# Patient Record
Sex: Male | Born: 1950 | Race: Asian | Marital: Married | Smoking: Former smoker
Health system: Southern US, Community
[De-identification: ages and names within clinical notes are randomized; demographics above are authoritative.]

## PROBLEM LIST (undated history)

## (undated) DIAGNOSIS — F32A Depression, unspecified: Secondary | ICD-10-CM

## (undated) DIAGNOSIS — F329 Major depressive disorder, single episode, unspecified: Secondary | ICD-10-CM

## (undated) DIAGNOSIS — G894 Chronic pain syndrome: Secondary | ICD-10-CM

## (undated) DIAGNOSIS — F4310 Post-traumatic stress disorder, unspecified: Secondary | ICD-10-CM

## (undated) DIAGNOSIS — N400 Enlarged prostate without lower urinary tract symptoms: Secondary | ICD-10-CM

## (undated) DIAGNOSIS — N200 Calculus of kidney: Secondary | ICD-10-CM

## (undated) HISTORY — DX: Calculus of kidney: N20.0

## (undated) HISTORY — DX: Major depressive disorder, single episode, unspecified: F32.9

## (undated) HISTORY — PX: CATARACT EXTRACTION, BILATERAL: SHX1313

## (undated) HISTORY — DX: Depression, unspecified: F32.A

## (undated) HISTORY — DX: Benign prostatic hyperplasia without lower urinary tract symptoms: N40.0

## (undated) HISTORY — DX: Post-traumatic stress disorder, unspecified: F43.10

## (undated) HISTORY — DX: Chronic pain syndrome: G89.4

---

## 1987-09-30 DIAGNOSIS — G894 Chronic pain syndrome: Secondary | ICD-10-CM

## 1987-09-30 HISTORY — DX: Chronic pain syndrome: G89.4

## 2013-09-29 DIAGNOSIS — N200 Calculus of kidney: Secondary | ICD-10-CM

## 2013-09-29 DIAGNOSIS — N400 Enlarged prostate without lower urinary tract symptoms: Secondary | ICD-10-CM

## 2013-09-29 HISTORY — DX: Benign prostatic hyperplasia without lower urinary tract symptoms: N40.0

## 2013-09-29 HISTORY — DX: Calculus of kidney: N20.0

## 2015-07-10 ENCOUNTER — Ambulatory Visit: Payer: Self-pay | Admitting: Internal Medicine

## 2015-07-20 ENCOUNTER — Ambulatory Visit (INDEPENDENT_AMBULATORY_CARE_PROVIDER_SITE_OTHER): Payer: Medicaid Other | Admitting: Internal Medicine

## 2015-07-20 ENCOUNTER — Encounter: Payer: Self-pay | Admitting: Internal Medicine

## 2015-07-20 VITALS — BP 114/72 | HR 68 | Ht 63.0 in | Wt 146.0 lb

## 2015-07-20 DIAGNOSIS — M79672 Pain in left foot: Secondary | ICD-10-CM

## 2015-07-20 DIAGNOSIS — F329 Major depressive disorder, single episode, unspecified: Secondary | ICD-10-CM

## 2015-07-20 DIAGNOSIS — M25572 Pain in left ankle and joints of left foot: Secondary | ICD-10-CM

## 2015-07-20 DIAGNOSIS — M545 Low back pain: Secondary | ICD-10-CM | POA: Diagnosis not present

## 2015-07-20 DIAGNOSIS — F32A Depression, unspecified: Secondary | ICD-10-CM

## 2015-07-20 DIAGNOSIS — F4310 Post-traumatic stress disorder, unspecified: Secondary | ICD-10-CM

## 2015-07-20 MED ORDER — SERTRALINE HCL 50 MG PO TABS: ORAL_TABLET | ORAL | Status: DC
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
## 2015-07-26 ENCOUNTER — Ambulatory Visit
Admission: RE | Admit: 2015-07-26 | Discharge: 2015-07-26 | Disposition: A | Discharge status: Home / Self Care | Payer: Medicaid Other | Source: Ambulatory Visit | Attending: Internal Medicine | Admitting: Internal Medicine

## 2015-07-26 DIAGNOSIS — M79672 Pain in left foot: Secondary | ICD-10-CM

## 2015-07-26 DIAGNOSIS — M25572 Pain in left ankle and joints of left foot: Secondary | ICD-10-CM

## 2015-08-03 ENCOUNTER — Other Ambulatory Visit: Payer: Self-pay | Admitting: Licensed Clinical Social Worker

## 2015-08-03 ENCOUNTER — Ambulatory Visit (INDEPENDENT_AMBULATORY_CARE_PROVIDER_SITE_OTHER): Payer: Medicaid Other | Admitting: Internal Medicine

## 2015-08-03 ENCOUNTER — Encounter: Payer: Self-pay | Admitting: Internal Medicine

## 2015-08-03 VITALS — BP 122/78 | HR 72 | Ht 63.0 in | Wt 145.0 lb

## 2015-08-03 DIAGNOSIS — F329 Major depressive disorder, single episode, unspecified: Secondary | ICD-10-CM | POA: Diagnosis not present

## 2015-08-03 DIAGNOSIS — M722 Plantar fascial fibromatosis: Secondary | ICD-10-CM

## 2015-08-03 DIAGNOSIS — Z79899 Other long term (current) drug therapy: Secondary | ICD-10-CM | POA: Diagnosis not present

## 2015-08-03 DIAGNOSIS — F4310 Post-traumatic stress disorder, unspecified: Secondary | ICD-10-CM

## 2015-08-03 DIAGNOSIS — F32A Depression, unspecified: Secondary | ICD-10-CM

## 2015-08-03 MED ORDER — DICLOFENAC SODIUM 75 MG PO TBEC: 75.0000 mg | DELAYED_RELEASE_TABLET | Freq: Two times a day (BID) | ORAL | Status: DC

## 2015-08-03 NOTE — Progress Notes (Signed)
   Subjective:    Patient ID: Bernard Johnson, male    DOB: 05/11/1951, 64 y.o.   MRN: [id number]  HPI   1.  Left foot and ankle Pain: Xrays showed no bony abnormality other than a dorsal calcaneal spur--not plantar.  He is doing stretches and wearing heel cups per his son in law.    2.  Depression:  Taking Sertraline 50 mg now for about 1 week.  Titrated up to this from 25 mg daily.  Has not noted any improvement.  Still having difficulties with sleep.  Takes Sertraline at bedtime.  Sleep is not worse, just no better. No suicidal ideation.  Did not make counseling appt. Earlier today with Nilda SimmerNatosha Knight, LCSW at our clinic.    Review of Systems     Objective:   Physical Exam NAD Little eye contact at first.  Appears to have depressed facial affect. Laughs at one point in discussion, which I have not seen before. Tenderness over plantar and medial aspect of left foot without changes.       Assessment & Plan:  1.  Left ankle and foot pain:  Appears to mainly be plantar fasciitis.  Refer for physical therapy.  Went over regular stretches. Check BMP and start Diclofenac  2.  Depression and PTSD:  Discussed he needs to be on Sertraline for about 6 weeks before can see whether will be of any benefit.  Only on 50 mg for about 1 week. Follow up in 4-6 weeks.   Rescheduled for counseling with Nilda SimmerNatosha Knight.

## 2015-08-03 NOTE — Patient Instructions (Addendum)
Always take Diclofenac with food. Take it twice daily for 2 weeks, and then take twice daily only when you have pain. Always wear shoes and heel cups in shoes--never barefoot. Get nice cushioned slippers for when you walk around in home.

## 2015-08-04 LAB — BASIC METABOLIC PANEL
BUN / CREAT RATIO: 9 — AB (ref 10–22)
BUN: 8 mg/dL (ref 8–27)
CHLORIDE: 102 mmol/L (ref 97–106)
CO2: 26 mmol/L (ref 18–29)
CREATININE: 0.88 mg/dL (ref 0.76–1.27)
Calcium: 9.4 mg/dL (ref 8.6–10.2)
GFR calc Af Amer: 106 mL/min/{1.73_m2} (ref >59)
GFR calc non Af Amer: 91 mL/min/{1.73_m2} (ref >59)
GLUCOSE: 84 mg/dL (ref 65–99)
Potassium: 4.6 mmol/L (ref 3.5–5.2)
Sodium: 143 mmol/L (ref 136–144)

## 2015-08-05 DIAGNOSIS — F329 Major depressive disorder, single episode, unspecified: Secondary | ICD-10-CM | POA: Insufficient documentation

## 2015-08-05 DIAGNOSIS — F4310 Post-traumatic stress disorder, unspecified: Secondary | ICD-10-CM | POA: Insufficient documentation

## 2015-08-05 DIAGNOSIS — F32A Depression, unspecified: Secondary | ICD-10-CM | POA: Insufficient documentation

## 2015-08-10 ENCOUNTER — Other Ambulatory Visit: Payer: Self-pay | Admitting: Licensed Clinical Social Worker

## 2015-08-13 ENCOUNTER — Other Ambulatory Visit: Payer: Medicaid Other | Admitting: Licensed Clinical Social Worker

## 2015-09-14 ENCOUNTER — Ambulatory Visit: Payer: Medicaid Other | Admitting: Internal Medicine

## 2015-10-23 ENCOUNTER — Ambulatory Visit (INDEPENDENT_AMBULATORY_CARE_PROVIDER_SITE_OTHER): Payer: Medicaid Other | Admitting: Internal Medicine

## 2015-10-23 VITALS — BP 118/74 | HR 74 | Ht 63.0 in | Wt 152.0 lb

## 2015-10-23 DIAGNOSIS — M7050 Other bursitis of knee, unspecified knee: Secondary | ICD-10-CM

## 2015-10-23 DIAGNOSIS — S86811A Strain of other muscle(s) and tendon(s) at lower leg level, right leg, initial encounter: Secondary | ICD-10-CM

## 2015-10-23 DIAGNOSIS — F32A Depression, unspecified: Secondary | ICD-10-CM

## 2015-10-23 DIAGNOSIS — M7150 Other bursitis, not elsewhere classified, unspecified site: Secondary | ICD-10-CM

## 2015-10-23 DIAGNOSIS — S86111A Strain of other muscle(s) and tendon(s) of posterior muscle group at lower leg level, right leg, initial encounter: Secondary | ICD-10-CM

## 2015-10-23 DIAGNOSIS — S76111A Strain of right quadriceps muscle, fascia and tendon, initial encounter: Secondary | ICD-10-CM | POA: Diagnosis not present

## 2015-10-23 DIAGNOSIS — F329 Major depressive disorder, single episode, unspecified: Secondary | ICD-10-CM | POA: Diagnosis not present

## 2015-10-23 DIAGNOSIS — F4310 Post-traumatic stress disorder, unspecified: Secondary | ICD-10-CM

## 2015-10-23 NOTE — Patient Instructions (Signed)
Ibuprofen 200 mg 2 - 4 tabs twice daily with food when your knee hurts. Do strengthening exercises for quadriceps twice daily--10 repetitions each leg  Stretches in bed with legs out in front and straddle position--reach for toes with knees straight--1 repetitions with legs together--no bouncing and 10 repetitions to each leg in straddle position--again no bouncing.

## 2015-10-23 NOTE — Progress Notes (Signed)
   Subjective:    Patient ID: Bernard Johnson, male    DOB: 1951-07-08, 65 y.o.   MRN: [id number]  HPI   1.  Left ankle pain and plantar fasciitis:  Went to PT and given home exercises and pain is now gone.  Continuing with exercises at home.  Not walking around barefoot at home.  2.  Right knee pain:  Started about 2 months ago.  Does not recall injury.  Anterior knee started hurting first with swelling, then pain radiated to medial calf and medial distal quadriceps area, then finally to lateral calf.  Felt knots in his calf and popliteal fossa at times.  Pain is like inflammation.  Just sitting, he can have the pain.  More pain when he is not using the leg.  When he walks, it feels better.  3.  Depression:  Taking Sertraline regularly before bedtime. States he no longer feels depressed, but still unable to fall asleep.  States he feels hot and then cold.  Does not feel just like a fever.  Sounds like he tosses and turns, reads stuff on phone or reads a book, pulls a hat over his eyes. Can get out of bed and wander about the house. This has been a problem since before left [place].   Does not feel the Sertraline "revs" him up at night. 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   Temp 98.0 when felt hot during exam.   Current outpatient prescriptions:  .  diclofenac (VOLTAREN) 75 MG EC tablet, Take 1 tablet (75 mg total) by mouth 2 (two) times daily., Disp: 60 tablet, Rfl: 1 .  sertraline (ZOLOFT) 50 MG tablet, 1/2 tab by mouth daily for 7 days, then increase to 1 tab daily (Patient taking differently: Take 50 mg by mouth daily. 1 tab daily), Disp: 30 tablet, Rfl: 3   No Known Allergies   Review of Systems     Objective:   Physical Exam NAD Is making eye contact and interacting much more than in previous evaluations Lungs:  CTA CV:  RRR Right LE:  Knee without  redness or swelling, Full ROM.   NT with palpation over patella and along joint line bilaterally.  No pain or laxity with stress of cruciates or collaterals.  Tender over distal medial quadriceps just above knee, pes anserine bursa, and proximal lateral gastroc area.  No thickening or mass in these areas.  No cords palpated.  No erythema in areas noted      Assessment & Plan:  1.  Depression/PTSD/Insomnia:  Continue Sertraline.  Asked to make sure following up with Bernard Dada, LCSW especially for PTSD, which is likely cause for many of problems with sleep.  He seems to be interacting better.  2.  Right leg/knee complaints:  Antiinflammatories, stretches.  Consider PT again for this if does not resolve.  Stay active.  3.  Left plantar fasciitis and ankle pain:  resolved

## 2015-11-02 ENCOUNTER — Other Ambulatory Visit: Payer: Medicaid Other | Admitting: Licensed Clinical Social Worker

## 2015-11-19 ENCOUNTER — Other Ambulatory Visit: Payer: Medicaid Other | Admitting: Licensed Clinical Social Worker

## 2015-11-30 ENCOUNTER — Ambulatory Visit (INDEPENDENT_AMBULATORY_CARE_PROVIDER_SITE_OTHER): Payer: Medicaid Other | Admitting: Licensed Clinical Social Worker

## 2015-11-30 DIAGNOSIS — F329 Major depressive disorder, single episode, unspecified: Secondary | ICD-10-CM | POA: Diagnosis not present

## 2015-11-30 DIAGNOSIS — F32A Depression, unspecified: Secondary | ICD-10-CM

## 2015-11-30 NOTE — Progress Notes (Signed)
   THERAPY PROGRESS NOTE  Session Time: 30min  Participation Level: Minimal  Behavioral Response: CasualAlertDepressed  Type of Therapy: Individual Therapy  Treatment Goals addressed: Coping  Interventions: Supportive  Summary: Casey BurkittJit Orantes is a 65 y.o. male who presents with a depressed mood and flat affect. Ramces's son-in-law was scheduled to attend as interpreter, but he was unable to attend due to work. Giovanne's daughter interpreted for the meeting but had a limited grasp of English. Paolo reported that he is having severe knee and leg pain, as well as headaches. He reported that he would like to reschedule the appointment with LCSW with his son-in-law.  Suicidal/Homicidal: Nowithout intent/plan  Therapist Response: LCSW attempted to complete a session with Ald[place] but was unable due to lack of interpreter. Several other family members were in and out of the room throughout the session. LCSW chatted with several family members and inquired about family dynamics.   Plan: Return again in 1 weeks.  Diagnosis: Axis I: Pending    Axis II: No diagnosis    Nilda Simmeratosha Jenifer Struve, LCSW 11/30/2015

## 2015-12-03 ENCOUNTER — Ambulatory Visit (INDEPENDENT_AMBULATORY_CARE_PROVIDER_SITE_OTHER): Payer: Medicaid Other | Admitting: Licensed Clinical Social Worker

## 2015-12-03 DIAGNOSIS — F4310 Post-traumatic stress disorder, unspecified: Secondary | ICD-10-CM | POA: Diagnosis not present

## 2015-12-03 DIAGNOSIS — F329 Major depressive disorder, single episode, unspecified: Secondary | ICD-10-CM

## 2015-12-03 DIAGNOSIS — F32A Depression, unspecified: Secondary | ICD-10-CM
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
## 2015-12-31 ENCOUNTER — Encounter: Payer: Self-pay | Admitting: Internal Medicine

## 2015-12-31 ENCOUNTER — Ambulatory Visit (INDEPENDENT_AMBULATORY_CARE_PROVIDER_SITE_OTHER): Payer: Medicaid Other | Admitting: Internal Medicine

## 2015-12-31 VITALS — BP 120/82 | HR 76 | Resp 18 | Ht 63.0 in | Wt 151.0 lb

## 2015-12-31 DIAGNOSIS — M722 Plantar fascial fibromatosis: Secondary | ICD-10-CM | POA: Diagnosis not present

## 2015-12-31 DIAGNOSIS — F329 Major depressive disorder, single episode, unspecified: Secondary | ICD-10-CM

## 2015-12-31 DIAGNOSIS — M25561 Pain in right knee: Secondary | ICD-10-CM | POA: Diagnosis not present

## 2015-12-31 DIAGNOSIS — G4700 Insomnia, unspecified: Secondary | ICD-10-CM | POA: Diagnosis not present

## 2015-12-31 DIAGNOSIS — F32A Depression, unspecified: Secondary | ICD-10-CM

## 2015-12-31 MED ORDER — TRAZODONE HCL 50 MG PO TABS: 25.0000 mg | ORAL_TABLET | Freq: Every evening | ORAL | Status: DC | PRN

## 2015-12-31 MED ORDER — DICLOFENAC SODIUM 75 MG PO TBEC: 75.0000 mg | DELAYED_RELEASE_TABLET | Freq: Two times a day (BID) | ORAL | Status: DC

## 2015-12-31 NOTE — Patient Instructions (Signed)
Stretches without bouncing twice daily Take Diclofenac twice daily with meals for 2 weeks, then take as needed twice daily when has pain thereafter.

## 2015-12-31 NOTE — Progress Notes (Signed)
   Subjective:    Patient ID: Bernard Johnson, male    DOB: 09/25/1951, 65 y.o.   MRN: [id number]  HPI -- 1.  Right leg and knee pain:  Is performing stretches, but not the ones we discussed.  He internally and externally rotates knee and then lifts leg.  He also is bouncing with his counting as well.  Has not been taking Diclofenac.  2.  Insomnia/Depression/PTSD:  Still not sleeping well.  Feels counseling sessions with her have been helpful.  He would like more home visits on a regular basis.  Describes burning in head with sense of fear at times. Once calms, goes away. When tries to fall asleep, has a fear or sense that someone will try and choke him--feels pressure there and cannot get to sleep.  He would be interested in trying a medicine to help him relax and sleep at night. 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     Current outpatient prescriptions:  .  sertraline (ZOLOFT) 50 MG tablet, 1/2 tab by mouth daily for 7 days, then increase to 1 tab daily (Patient taking differently: Take 50 mg by mouth daily. 1 tab daily), Disp: 30 tablet, Rfl: 3 .  diclofenac (VOLTAREN) 75 MG EC tablet, Take 1 tablet (75 mg total) by mouth 2 (two) times daily., Disp: 60 tablet, Rfl: 3    Review of Systems     Objective:   Physical Exam   NAD Right Knee:  Full ROM, but moves a bit gingerly.  Does favor knee with gait.  Possibly mild swelling/effusion, no erythema, Mild medial knee discomfort with stress of medial collateral ligament, No laxity with stress of collaterals or cruciates.  Mild tenderness with palpation of joint line medially only.        Assessment & Plan:  1.  Depression, PTSD, Insomnia:  Add Trazadone 25 mg at bedtime.  Notify [name], LCSW of patient's interest in continued couneling.  2.  Right knee pain:  Went over stretches again.  Today, does seem to have actual medial knee involvement and not just surrounding ligaments/tendons/muscle discomfort.  Refilled Diclofenac to take twice  daily regularly for 2 weeks.  Went over how to do exercises again.  SEnd for xray.   If no improvement in 6 weeks when follows up, will inject. To call if worsens between now and then

## 2016-01-04 ENCOUNTER — Encounter (HOSPITAL_COMMUNITY): Payer: Self-pay | Admitting: Emergency Medicine

## 2016-01-04 ENCOUNTER — Ambulatory Visit (HOSPITAL_COMMUNITY)
Admission: EM | Admit: 2016-01-04 | Discharge: 2016-01-04 | Disposition: A | Discharge status: Home / Self Care | Payer: Medicaid Other | Attending: Emergency Medicine | Admitting: Emergency Medicine

## 2016-01-04 ENCOUNTER — Ambulatory Visit (INDEPENDENT_AMBULATORY_CARE_PROVIDER_SITE_OTHER): Payer: Medicaid Other

## 2016-01-04 DIAGNOSIS — J069 Acute upper respiratory infection, unspecified: Secondary | ICD-10-CM

## 2016-01-04 DIAGNOSIS — B9789 Other viral agents as the cause of diseases classified elsewhere: Principal | ICD-10-CM

## 2016-01-04 MED ORDER — ALBUTEROL SULFATE HFA 108 (90 BASE) MCG/ACT IN AERS: 2.0000 | INHALATION_SPRAY | RESPIRATORY_TRACT | Status: DC | PRN

## 2016-01-04 MED ORDER — IPRATROPIUM-ALBUTEROL 0.5-2.5 (3) MG/3ML IN SOLN
RESPIRATORY_TRACT | Status: AC
  Filled 2016-01-04: qty 3

## 2016-01-04 MED ORDER — IPRATROPIUM-ALBUTEROL 0.5-2.5 (3) MG/3ML IN SOLN
3.0000 mL | Freq: Once | RESPIRATORY_TRACT | Status: AC
Start: 2016-01-04 — End: 2016-01-04
  Administered 2016-01-04: 3 mL via RESPIRATORY_TRACT

## 2016-01-04 MED ORDER — AMOXICILLIN 500 MG PO CAPS: 500.0000 mg | ORAL_CAPSULE | Freq: Three times a day (TID) | ORAL | Status: DC

## 2016-01-04 MED ORDER — BENZONATATE 100 MG PO CAPS: 100.0000 mg | ORAL_CAPSULE | Freq: Three times a day (TID) | ORAL | Status: DC

## 2016-01-04 NOTE — ED Notes (Signed)
Via daughter Rubbie Battiest(Nepali interpreter),  Pt c/o dry cough, dyspnea, chills A&O x4... No acute distress.

## 2016-01-04 NOTE — Discharge Instructions (Signed)
Upper Respiratory Infection, Adult °Most upper respiratory infections (URIs) are a viral infection of the air passages leading to the lungs. A URI affects the nose, throat, and upper air passages. The most common type of URI is nasopharyngitis and is typically referred to as "the common cold." °URIs run their course and usually go away on their own. Most of the time, a URI does not require medical attention, but sometimes a bacterial infection in the upper airways can follow a viral infection. This is called a secondary infection. Sinus and middle ear infections are common types of secondary upper respiratory infections. °Bacterial pneumonia can also complicate a URI. A URI can worsen asthma and chronic obstructive pulmonary disease (COPD). Sometimes, these complications can require emergency medical care and may be life threatening.  °CAUSES °Almost all URIs are caused by viruses. A virus is a type of germ and can spread from one person to another.  °RISKS FACTORS °You may be at risk for a URI if:  °· You smoke.   °· You have chronic heart or lung disease. °· You have a weakened defense (immune) system.   °· You are very young or very old.   °· You have nasal allergies or asthma. °· You work in crowded or poorly ventilated areas. °· You work in health care facilities or schools. °SIGNS AND SYMPTOMS  °Symptoms typically develop 2-3 days after you come in contact with a cold virus. Most viral URIs last 7-10 days. However, viral URIs from the influenza virus (flu virus) can last 14-18 days and are typically more severe. Symptoms may include:  °· Runny or stuffy (congested) nose.   °· Sneezing.   °· Cough.   °· Sore throat.   °· Headache.   °· Fatigue.   °· Fever.   °· Loss of appetite.   °· Pain in your forehead, behind your eyes, and over your cheekbones (sinus pain). °· Muscle aches.   °DIAGNOSIS  °Your health care provider may diagnose a URI by: °· Physical exam. °· Tests to check that your symptoms are not due to  another condition such as: °· Strep throat. °· Sinusitis. °· Pneumonia. °· Asthma. °TREATMENT  °A URI goes away on its own with time. It cannot be cured with medicines, but medicines may be prescribed or recommended to relieve symptoms. Medicines may help: °· Reduce your fever. °· Reduce your cough. °· Relieve nasal congestion. °HOME CARE INSTRUCTIONS  °· Take medicines only as directed by your health care provider.   °· Gargle warm saltwater or take cough drops to comfort your throat as directed by your health care provider. °· Use a warm mist humidifier or inhale steam from a shower to increase air moisture. This may make it easier to breathe. °· Drink enough fluid to keep your urine clear or pale yellow.   °· Eat soups and other clear broths and maintain good nutrition.   °· Rest as needed.   °· Return to work when your temperature has returned to normal or as your health care provider advises. You may need to stay home longer to avoid infecting others. You can also use a face mask and careful hand washing to prevent spread of the virus. °· Increase the usage of your inhaler if you have asthma.   °· Do not use any tobacco products, including cigarettes, chewing tobacco, or electronic cigarettes. If you need help quitting, ask your health care provider. °PREVENTION  °The best way to protect yourself from getting a cold is to practice good hygiene.  °· Avoid oral or hand contact with people with cold   symptoms.   °· Wash your hands often if contact occurs.   °There is no clear evidence that vitamin C, vitamin E, echinacea, or exercise reduces the chance of developing a cold. However, it is always recommended to get plenty of rest, exercise, and practice good nutrition.  °SEEK MEDICAL CARE IF:  °· You are getting worse rather than better.   °· Your symptoms are not controlled by medicine.   °· You have chills. °· You have worsening shortness of breath. °· You have brown or red mucus. °· You have yellow or brown nasal  discharge. °· You have pain in your face, especially when you bend forward. °· You have a fever. °· You have swollen neck glands. °· You have pain while swallowing. °· You have white areas in the back of your throat. °SEEK IMMEDIATE MEDICAL CARE IF:  °· You have severe or persistent: °¨ Headache. °¨ Ear pain. °¨ Sinus pain. °¨ Chest pain. °· You have chronic lung disease and any of the following: °¨ Wheezing. °¨ Prolonged cough. °¨ Coughing up blood. °¨ A change in your usual mucus. °· You have a stiff neck. °· You have changes in your: °¨ Vision. °¨ Hearing. °¨ Thinking. °¨ Mood. °MAKE SURE YOU:  °· Understand these instructions. °· Will watch your condition. °· Will get help right away if you are not doing well or get worse. °  °This information is not intended to replace advice given to you by your health care provider. Make sure you discuss any questions you have with your health care provider. °  °Document Released: 03/11/2001 Document Revised: 01/30/2015 Document Reviewed: 12/21/2013 °Elsevier Interactive Patient Education ©2016 [employer] Inc[employer] ° °Cough, Adult °A cough helps to clear your throat and lungs. A cough may last only 2-3 weeks (acute), or it may last longer than 8 weeks (chronic). Many different things can cause a cough. A cough may be a sign of an illness or another medical condition. °HOME CARE °· Pay attention to any changes in your cough. °· Take medicines only as told by your doctor. °¨ If you were prescribed an antibiotic medicine, take it as told by your doctor. Do not stop taking it even if you start to feel better. °¨ Talk with your doctor before you try using a cough medicine. °· Drink enough fluid to keep your pee (urine) clear or pale yellow. °· If the air is dry, use a cold steam vaporizer or humidifier in your home. °· Stay away from things that make you cough at work or at home. °· If your cough is worse at night, try using extra pillows to raise your head up higher while you  sleep. °· Do not smoke, and try not to be around smoke. If you need help quitting, ask your doctor. °· Do not have caffeine. °· Do not drink alcohol. °· Rest as needed. °GET HELP IF: °· You have new problems (symptoms). °· You cough up yellow fluid (pus). °· Your cough does not get better after 2-3 weeks, or your cough gets worse. °· Medicine does not help your cough and you are not sleeping well. °· You have pain that gets worse or pain that is not helped with medicine. °· You have a fever. °· You are losing weight and you do not know why. °· You have night sweats. °GET HELP RIGHT AWAY IF: °· You cough up blood. °· You have trouble breathing. °· Your heartbeat is very fast. °  °This information is not intended to replace   advice given to you by your health care provider. Make sure you discuss any questions you have with your health care provider. °  °Document Released: 05/29/2011 Document Revised: 06/06/2015 Document Reviewed: 11/22/2014 °Elsevier Interactive Patient Education ©2016 [employer]. ° °
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
## 2016-01-14 ENCOUNTER — Ambulatory Visit (HOSPITAL_COMMUNITY)
Admission: RE | Admit: 2016-01-14 | Discharge: 2016-01-14 | Disposition: A | Discharge status: Home / Self Care | Payer: Medicaid Other | Source: Ambulatory Visit | Attending: Internal Medicine | Admitting: Internal Medicine

## 2016-01-14 DIAGNOSIS — M25561 Pain in right knee: Secondary | ICD-10-CM | POA: Insufficient documentation

## 2016-01-18 ENCOUNTER — Other Ambulatory Visit: Payer: Medicaid Other | Admitting: Licensed Clinical Social Worker

## 2016-01-28 ENCOUNTER — Encounter: Payer: Self-pay | Admitting: Internal Medicine

## 2016-01-28 ENCOUNTER — Ambulatory Visit (INDEPENDENT_AMBULATORY_CARE_PROVIDER_SITE_OTHER): Payer: Medicaid Other | Admitting: Internal Medicine

## 2016-01-28 VITALS — BP 112/80 | HR 84 | Resp 18 | Ht 63.0 in | Wt 147.0 lb

## 2016-01-28 DIAGNOSIS — M25561 Pain in right knee: Secondary | ICD-10-CM

## 2016-01-28 NOTE — Patient Instructions (Signed)
Call the clinic or got to ED if develop increased pain, redness, swelling, fever  Do not over do physical activity or you will cause injury to your knee

## 2016-01-28 NOTE — Progress Notes (Signed)
   Subjective:    Patient ID: Bernard Johnson, male    DOB: 09/22/1951, 65 y.o.   MRN: [id number]  HPI  Son in law, Bernard Johnson, interprets today.  Here today with right knee pain.  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  Pain more so in past 1 week.  Denies recent reinjury of joint.  Not sleeping well.  Upper calf, entire knee and just above the knee all hurt.  States he is doing stretching exercises, but not doing any better.  Xrays last month. Xray on 01/14/2016 showed mild spurring, but otherwise normal. Hurts more when he is not moving around--like when trying to sleep.   Current outpatient prescriptions:  .  benzonatate (TESSALON) 100 MG capsule, Take 1 capsule (100 mg total) by mouth every 8 (eight) hours., Disp: 21 capsule, Rfl: 0 .  diclofenac (VOLTAREN) 75 MG EC tablet, Take 1 tablet (75 mg total) by mouth 2 (two) times daily., Disp: 60 tablet, Rfl: 3 .  sertraline (ZOLOFT) 50 MG tablet, 1/2 tab by mouth daily for 7 days, then increase to 1 tab daily (Patient taking differently: Take 50 mg by mouth daily. 1 tab daily), Disp: 30 tablet, Rfl: 3 .  traZODone (DESYREL) 50 MG tablet, Take 0.5-1 tablets (25-50 mg total) by mouth at bedtime as needed for sleep., Disp: 30 tablet, Rfl: 11 .  albuterol (PROVENTIL HFA;VENTOLIN HFA) 108 (90 Base) MCG/ACT inhaler, Inhale 2 puffs into the lungs every 4 (four) hours as needed for wheezing or shortness of breath. (Patient not taking: Reported on 01/28/2016), Disp: 1 Inhaler, Rfl: 0 .  amoxicillin (AMOXIL) 500 MG capsule, Take 1 capsule (500 mg total) by mouth 3 (three) times daily. (Patient not taking: Reported on 01/28/2016), Disp: 21 capsule, Rfl: 0   No Known Allergies  Review of Systems     Objective:   Physical Exam Right knee with very slight effusion.  Tender along both medial and anteriolateral joint line.  Tender posteriorly as well, but no abnormality palpated in popliteal space.  Tender mildly in upper calf  musculature, but no tenseness or cord palpated.  No LE edema.  DP pulse normal and equal  To left.   Decreased flexion to about 70 Degrees.  Discussed through interpretation with son in law, the possibility of corticosteroid injection.  Discussed main complications of introducing infection or bleeding.  Pt. Chose to proceed with injection to right knee.  Consent signed Medial joint area cleaned in sterile fashion.  1% Lidocaine 3 cc injected SQ and to deeper tissue along injection path.  Depomedrol 1cc mixed with 0.5 ml 1% lidocaine injected to medial joint.  Pt. Tolerated well without complication.     Assessment & Plan:  Right Knee DJD and Pain:  Depomedrol 40 mg injected to right knee.  To call if signs of infection or bleeding.

## 2016-02-12 ENCOUNTER — Ambulatory Visit: Payer: Medicaid Other | Admitting: Internal Medicine

## 2016-03-03 ENCOUNTER — Encounter: Payer: Self-pay | Admitting: Internal Medicine

## 2016-03-03 ENCOUNTER — Ambulatory Visit (INDEPENDENT_AMBULATORY_CARE_PROVIDER_SITE_OTHER): Payer: Medicaid Other | Admitting: Internal Medicine

## 2016-03-03 VITALS — BP 100/60 | HR 70 | Resp 18 | Ht 63.75 in | Wt 146.0 lb

## 2016-03-03 DIAGNOSIS — F32A Depression, unspecified: Secondary | ICD-10-CM

## 2016-03-03 DIAGNOSIS — M25461 Effusion, right knee: Secondary | ICD-10-CM | POA: Diagnosis not present

## 2016-03-03 DIAGNOSIS — H5319 Other subjective visual disturbances: Secondary | ICD-10-CM

## 2016-03-03 DIAGNOSIS — F329 Major depressive disorder, single episode, unspecified: Secondary | ICD-10-CM

## 2016-03-03 DIAGNOSIS — H53143 Visual discomfort, bilateral: Secondary | ICD-10-CM

## 2016-03-03 DIAGNOSIS — M25561 Pain in right knee: Secondary | ICD-10-CM | POA: Diagnosis not present

## 2016-03-03 MED ORDER — SERTRALINE HCL 50 MG PO TABS: ORAL_TABLET | ORAL | Status: DC

## 2016-03-03 NOTE — Progress Notes (Signed)
Subjective:    Patient ID: Bernard Johnson, male    DOB: 05/10/1951, 65 y.o.   MRN: [id number]  HPI   1.  Right knee:  States better after the injection.  Is not doing much work out in garden as if he walks around too much he has a little pain in knee.  Has several Diclofenac pills left over from last month's bottle. States he separated a newer Rx into his old bottle for what he feels is ease of use. Discussed to make sure he takes the pills if having pain.  Does not need to take the medicine if not much pain.  2.  Depression/Insomnia:  Sounds like the pharmacy did not fill his Sertraline about 2 months ago.  Did not feel well after stopped.  Did not realize he was to continue if there were no refills.   Is taking Trazadone at bedtime 50 mg, but does not feel it helps.  In the past month and a half, he has been going to bed at 6:30 pm as his eyes hurt from the light, so he turns off the lights and goes to bed, but lies there until 2 am awake.    3.  Eye pain, with light.  Very difficult to follow.  States has pain with daylight or lights in home, eyes start to hurt, then gets burning pain over top of head to nuchal area.  If he closes eyes for a while, he feels better.  No associated nausea or vomiting.  Possibly some related PHonophobia.   Does not start with headache, starts with eye pain.  No throbbing pain in head. Started about 1 1/2 months ago.  Sounds like he has this daily.   He does describe halos at times around nighttime outdoor lights. Has not had an eye check in 2 years.   Current outpatient prescriptions:  .  albuterol (PROVENTIL HFA;VENTOLIN HFA) 108 (90 Base) MCG/ACT inhaler, Inhale 2 puffs into the lungs every 4 (four) hours as needed for wheezing or shortness of breath., Disp: 1 Inhaler, Rfl: 0 .  diclofenac (VOLTAREN) 75 MG EC tablet, Take 1 tablet (75 mg total) by mouth 2 (two) times daily., Disp: 60 tablet, Rfl: 3 .  traZODone (DESYREL) 50 MG tablet, Take 0.5-1 tablets (25-50 mg  total) by mouth at bedtime as needed for sleep., Disp: 30 tablet, Rfl: 11 .  sertraline (ZOLOFT) 50 MG tablet, 1/2 tab by mouth daily for 7 days, then increase to 1 tab daily (Patient not taking: Reported on 03/03/2016), Disp: 30 tablet, Rfl: 3  No Known Allergies  Review of Systems     Objective:   Physical Exam  NAD, limps, favoring right leg  HEENT:  Bilateral cataracts--unable to see past to evaluate discs well.  PERRL, EOMI Neck:  Supple, no adenopathy Chest: CTA CV:  RRR without murmur or rub Right KNee; No erythema or increased warmth. Palpable effusion, appears actually more prominent than prior to corticosteroid injection 1 month ago.  Full ROM, Tender along anterior-medial joint line.  No posterior tenderness today.  No definitive tenderness or laxity with cruciate or collateral ligament stress maneuvers.          Assessment & Plan:  1.  Right knee pain and effusion:  Not an adequate response to injection, though from pt's perspective, he is doing better. Check RF, Sed rate, though expect to be normal.   Referral to Dr. Despina HickAlusio or partner, orthopedic surgery.  2.  Depression/PTSD:  Restart Sertraline.  Not clear if visual pain with light may be secondary to sudden stoppage of sertraline, but will see.  Follow up in 1 month  3.  Photophobia:  Referral to ophthalmology.  Does appear to have cataracts.  Would like to be screened for glaucoma as well.

## 2016-03-03 NOTE — Telephone Encounter (Signed)
Erroneous encounter

## 2016-03-04 LAB — RHEUMATOID FACTOR: Rhuematoid fact SerPl-aCnc: <10 IU/mL (ref 0.0–13.9)

## 2016-03-04 LAB — SEDIMENTATION RATE: Sed Rate: 5 mm/hr (ref 0–30)

## 2016-05-17 ENCOUNTER — Other Ambulatory Visit: Payer: Self-pay | Admitting: Internal Medicine

## 2016-05-17 DIAGNOSIS — M722 Plantar fascial fibromatosis: Secondary | ICD-10-CM

## 2016-05-19 ENCOUNTER — Telehealth: Payer: Self-pay | Admitting: Internal Medicine

## 2016-05-19 NOTE — Telephone Encounter (Signed)
To MD for approval

## 2016-05-20 NOTE — Telephone Encounter (Signed)
Can we draw this on his appointment scheduled for this Friday?

## 2016-05-21 NOTE — Telephone Encounter (Signed)
yes

## 2016-05-23 ENCOUNTER — Ambulatory Visit: Payer: Medicaid Other | Admitting: Internal Medicine

## 2016-07-07 ENCOUNTER — Telehealth: Payer: Self-pay | Admitting: Internal Medicine

## 2016-07-07 ENCOUNTER — Ambulatory Visit: Payer: Medicaid Other | Admitting: Internal Medicine

## 2016-07-07 NOTE — Telephone Encounter (Signed)
Patient would like refill of Desyrel 50 mg. Tablet and Voltaren 75 mg ec tablet.    Patient stated he went to pharmacy and was told to contact his physician for refill order.

## 2016-07-07 NOTE — Telephone Encounter (Signed)
Spoke with Pharmacy patient has refills [facility] [address]. Call patient notified refills at pharmacy and to pick up later

## 2016-07-07 NOTE — Telephone Encounter (Signed)
Bernard Johnson has a year's worth of refills through April 2018 for Trazodone or [name] also has refills for Diclofenac through November. Could you look into whether the pharmacy really has no more refills listed ?  Thanks

## 2016-07-22 IMAGING — CR DG KNEE COMPLETE 4+V*R*
4 series · 4 of 4 positions shown · non-contrast
Comparison: None

CLINICAL DATA: Right knee pain with small effusion for 1 year.

EXAM:
RIGHT KNEE - COMPLETE 4+ VIEW

[t knee ap right]
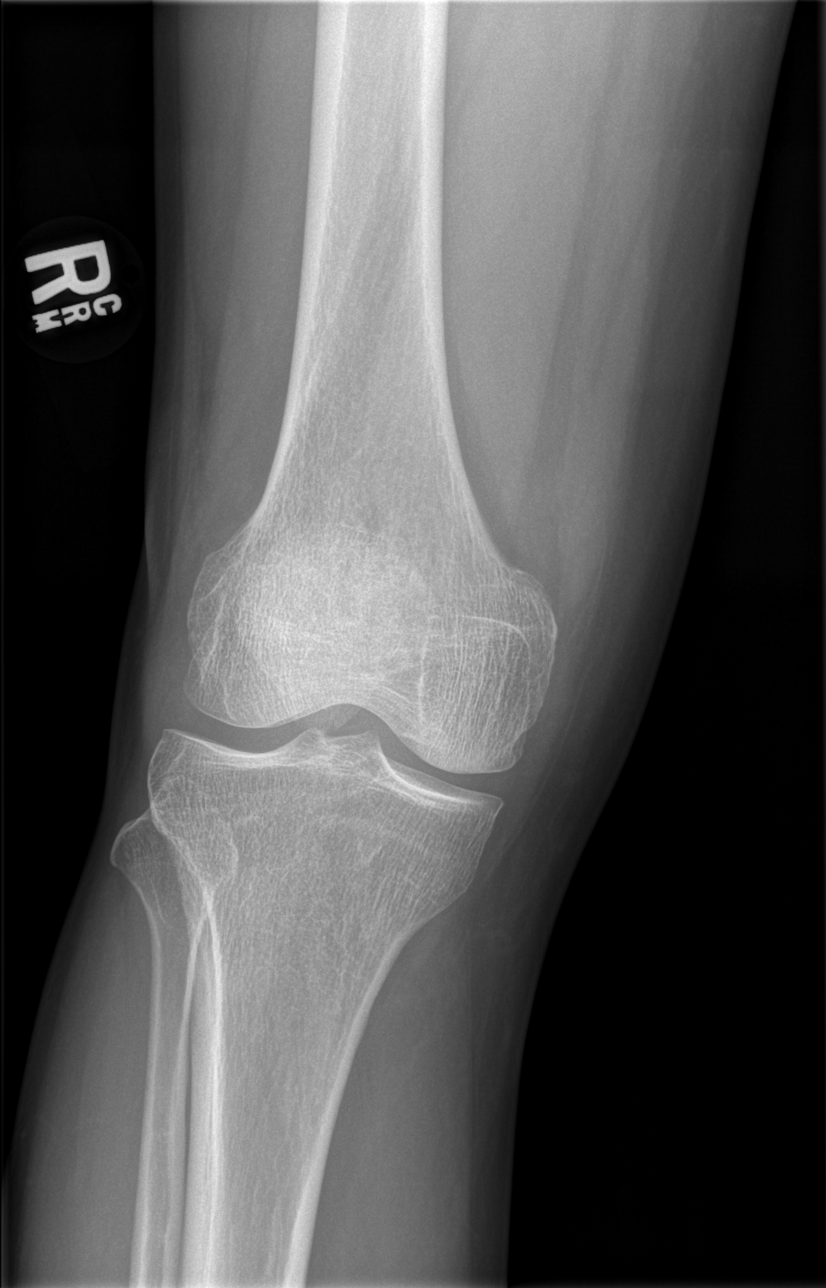

[t knee obl right (1 of 2)]
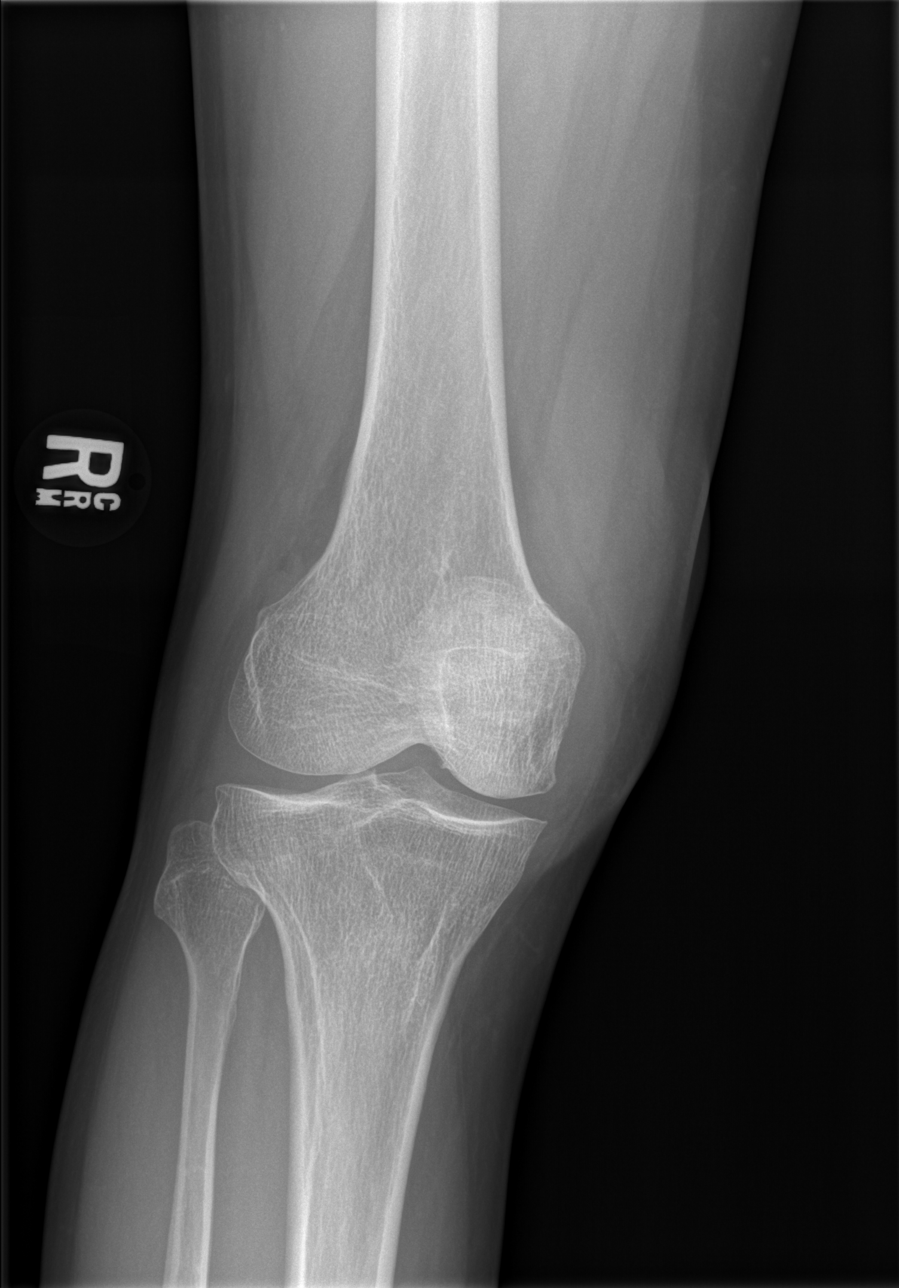

[t knee obl right (2 of 2)]
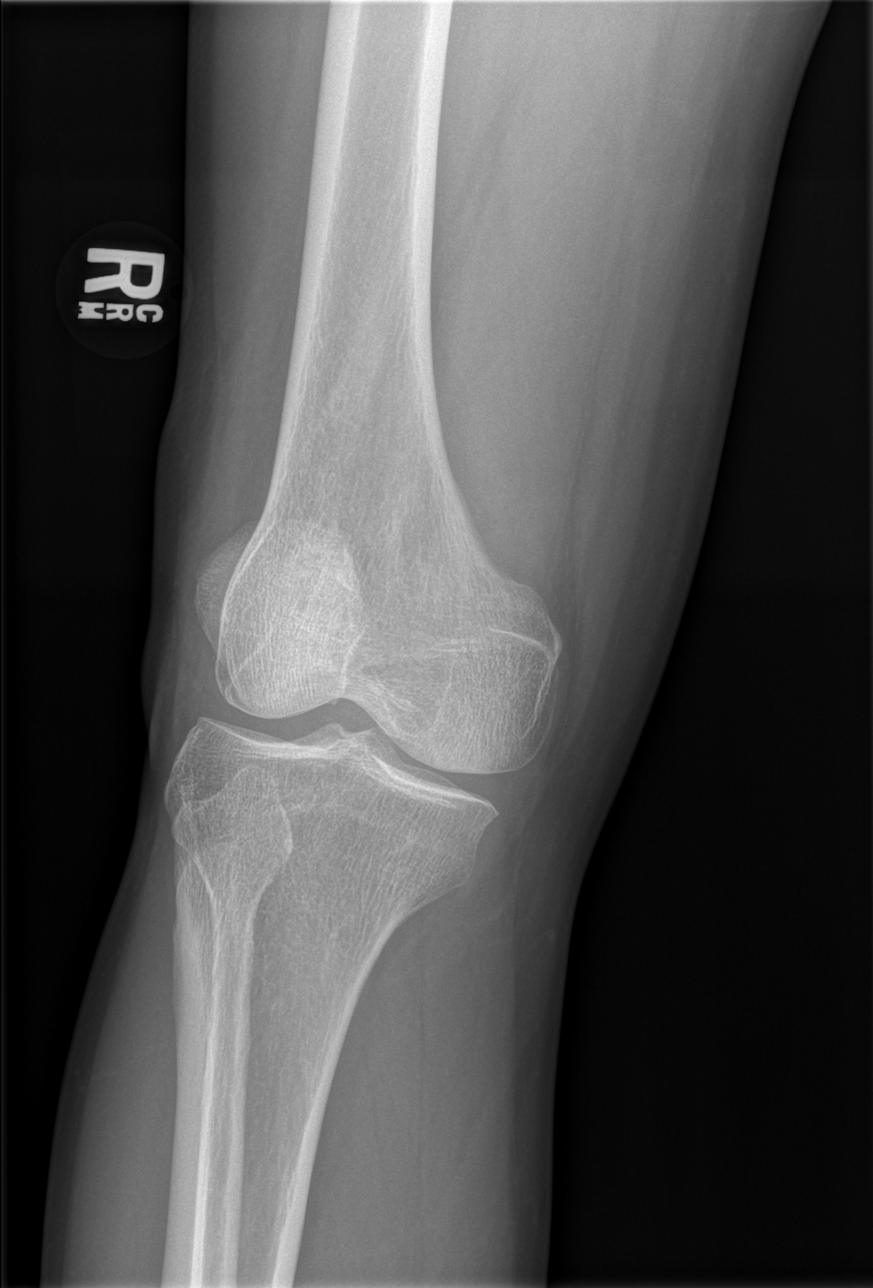

[t knee lat right]
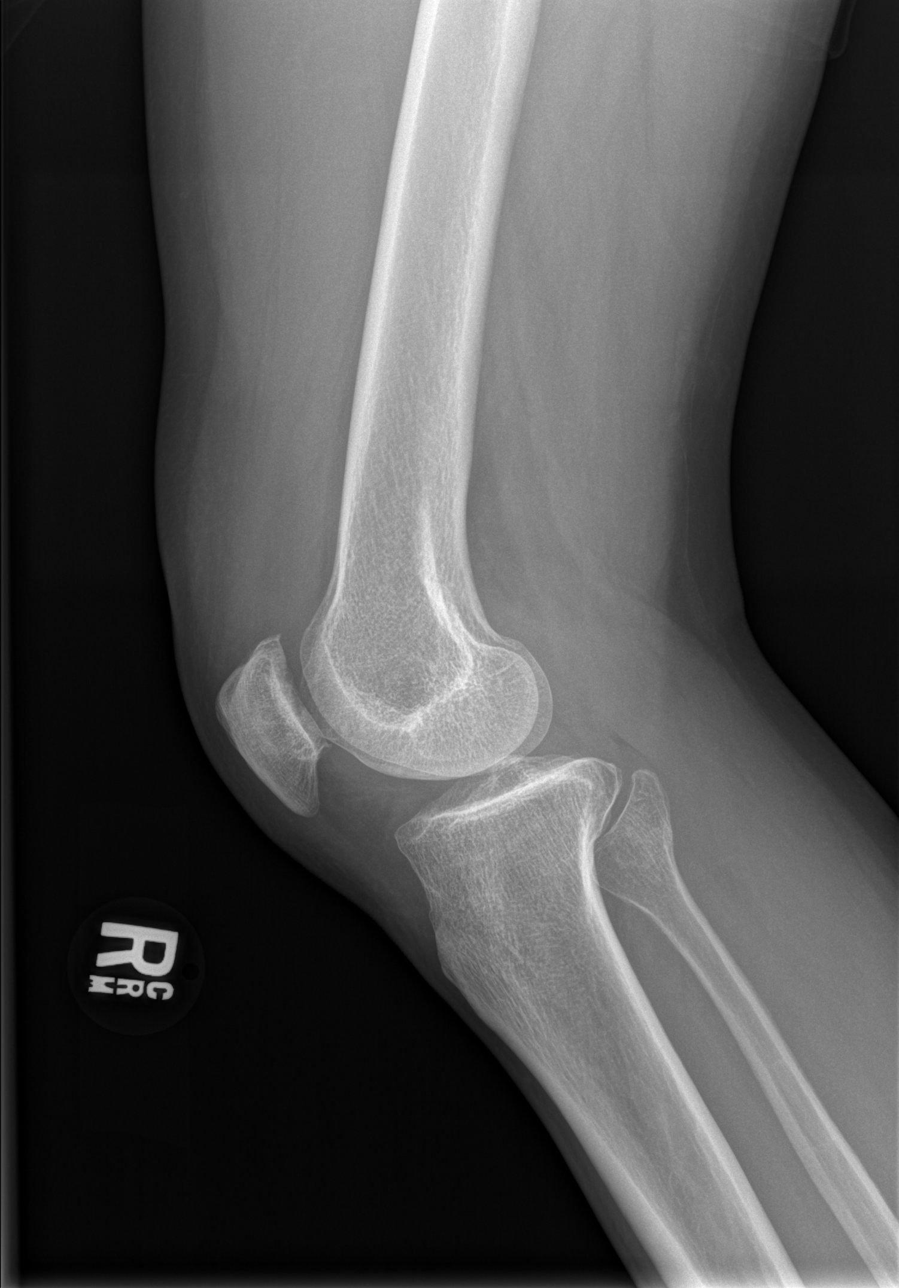

[4 of 4 positions shown; findings below may reference images not displayed]

FINDINGS: Mild degenerative spurring within the right knee. No acute bony
abnormality. Specifically, no fracture, subluxation, or dislocation.
Soft tissues are intact. No joint effusion.
IMPRESSION: No acute bony abnormality.

## 2016-08-25 ENCOUNTER — Encounter: Payer: Self-pay | Admitting: Internal Medicine

## 2016-08-25 ENCOUNTER — Ambulatory Visit: Payer: Medicaid Other | Admitting: Internal Medicine

## 2016-08-25 ENCOUNTER — Ambulatory Visit (INDEPENDENT_AMBULATORY_CARE_PROVIDER_SITE_OTHER): Payer: Medicaid Other | Admitting: Internal Medicine

## 2016-08-25 VITALS — BP 102/70 | HR 74 | Resp 12 | Ht 62.5 in | Wt 152.0 lb

## 2016-08-25 DIAGNOSIS — G8929 Other chronic pain: Secondary | ICD-10-CM | POA: Diagnosis not present

## 2016-08-25 DIAGNOSIS — M25561 Pain in right knee: Secondary | ICD-10-CM | POA: Diagnosis not present

## 2016-08-25 DIAGNOSIS — Z23 Encounter for immunization: Secondary | ICD-10-CM

## 2016-08-25 NOTE — Progress Notes (Signed)
   Subjective:    Patient ID: Bernard Johnson, male    DOB: 05/08/1951, 65 y.o.   MRN: [id number]  HPI   1. Right knee pain:  Some DJD by xray.  Still with swelling and pain keeping him from working in garden and keeping him up at night. For some reason, never made it to Dr. Despina HickAlusio, ortho for evaluation.   Did not have an adequate response to injection of joint.  Now mainly complains of lateral and posterior knee pain as well as some catching.  Hard to fully extend.  2.  Visual acuity decrease:  Had bilateral cataract extraction and diagnosed with glaucoma.  Vision is much better, using bifocals, has eye drops for glaucoma and no longer with headaches.   Did not bring eye drops with him today, cannot remember name--three medications in total.  3.  Albuterol inhaler:  Prescribed by ED in April.  States he uses this when his breathing is fast.  Describes using when his ears are bothering him and sometimes when going to sleep as breathing fast. Most weeks, no use, but can use up to twice weekly.  Current Meds  Medication Sig  . albuterol (PROVENTIL HFA;VENTOLIN HFA) 108 (90 Base) MCG/ACT inhaler Inhale 2 puffs into the lungs every 4 (four) hours as needed for wheezing or shortness of breath.  . diclofenac (VOLTAREN) 75 MG EC tablet TAKE 1 TABLET (75 MG TOTAL) BY MOUTH 2 (TWO) TIMES DAILY.  Marland Kitchen. sertraline (ZOLOFT) 50 MG tablet 1 tab by mouth daily  . traZODone (DESYREL) 50 MG tablet Take 0.5-1 tablets (25-50 mg total) by mouth at bedtime as needed for sleep.  plus 3  Unknown eye drops  No Known Allergies    Review of Systems     Objective:   Physical Exam NAD HEENT:  PERRL, EOMI Lungs:  CTA CV:  RRR without murmur or rub, radial pulses normal and equal Right Knee:  Difficulty with full extension, but able to fully extend with discomfort.  Small palpable effusion.  Tender mainly along lateral joint line this time.  No definite laxity or increased tenderness with stress of cruciates or  collateral ligaments       Assessment & Plan:  1.  Right Knee pain:  Likely lateral meniscal injury, though has had more medial symptoms in the past.  Referral to Dr. Despina HickAlusio again.  Not clear why this did not go through previously.  2.  Cataracts and Glaucoma:  Vision much better after addressed.  3.  [place] of Albuterol.  Encouraged not to use if not having wheezing or chest tightness--not to use for upper airway symptoms.

## 2016-09-19 ENCOUNTER — Ambulatory Visit (HOSPITAL_COMMUNITY)
Admission: EM | Admit: 2016-09-19 | Discharge: 2016-09-19 | Disposition: A | Discharge status: Home / Self Care | Payer: Medicaid Other | Attending: Emergency Medicine | Admitting: Emergency Medicine

## 2016-09-19 ENCOUNTER — Encounter (HOSPITAL_COMMUNITY): Payer: Self-pay | Admitting: *Deleted

## 2016-09-19 DIAGNOSIS — J45901 Unspecified asthma with (acute) exacerbation: Secondary | ICD-10-CM | POA: Diagnosis not present

## 2016-09-19 DIAGNOSIS — B9789 Other viral agents as the cause of diseases classified elsewhere: Secondary | ICD-10-CM | POA: Diagnosis not present

## 2016-09-19 DIAGNOSIS — J069 Acute upper respiratory infection, unspecified: Secondary | ICD-10-CM | POA: Diagnosis not present

## 2016-09-19 MED ORDER — ALBUTEROL SULFATE HFA 108 (90 BASE) MCG/ACT IN AERS: 1.0000 | INHALATION_SPRAY | RESPIRATORY_TRACT | 6 refills | Status: AC | PRN

## 2016-09-19 MED ORDER — ACETAMINOPHEN 325 MG PO TABS
650.0000 mg | ORAL_TABLET | Freq: Once | ORAL | Status: AC
  Administered 2016-09-19: 650 mg via ORAL

## 2016-09-19 MED ORDER — ACETAMINOPHEN 325 MG PO TABS
ORAL_TABLET | ORAL | Status: AC
  Filled 2016-09-19: qty 2

## 2016-09-19 MED ORDER — IPRATROPIUM-ALBUTEROL 0.5-2.5 (3) MG/3ML IN SOLN
3.0000 mL | Freq: Once | RESPIRATORY_TRACT | Status: AC
  Administered 2016-09-19: 3 mL via RESPIRATORY_TRACT

## 2016-09-19 MED ORDER — IPRATROPIUM-ALBUTEROL 0.5-2.5 (3) MG/3ML IN SOLN
RESPIRATORY_TRACT | Status: AC
  Filled 2016-09-19: qty 3

## 2016-09-19 MED ORDER — PREDNISONE 20 MG PO TABS: 60.0000 mg | ORAL_TABLET | Freq: Every day | ORAL | 0 refills | Status: DC
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
## 2016-09-19 NOTE — ED Triage Notes (Signed)
Cough   Congested     Tightness  In  Chest  X  3  Days        Pt  Has    Asthma

## 2016-11-07 ENCOUNTER — Telehealth: Payer: Self-pay | Admitting: Internal Medicine

## 2016-11-07 NOTE — Telephone Encounter (Signed)
To Dr. Mulberry for further direction 

## 2016-11-07 NOTE — Telephone Encounter (Signed)
Patient would like a note to be able to apply for [place] citizenship. Patient says the note should say he can not speak english and is taking anti-depression medication.

## 2016-11-10 NOTE — Telephone Encounter (Signed)
Patient called to follow up regarding letter needed from Dr. Delrae AlfredMulberry

## 2016-11-11 NOTE — Telephone Encounter (Signed)
To Dr. Mulberry  

## 2016-11-12 NOTE — Telephone Encounter (Signed)
I spoke with son in law some time ago and told him they did not meet criteria for the services requested.

## 2016-11-14 NOTE — Telephone Encounter (Incomplete)
Spoke with [name]. States he will stop by the office to find out what patient needs to have

## 2016-11-17 NOTE — Telephone Encounter (Signed)
Spoke with Dr. Delrae AlfredMulberry and she will do a home visit on 11/19/16 around 4 pm to speak with rewati and Veer to figure out what is needed.

## 2016-11-28 ENCOUNTER — Other Ambulatory Visit (INDEPENDENT_AMBULATORY_CARE_PROVIDER_SITE_OTHER): Payer: Medicaid Other | Admitting: Internal Medicine

## 2016-11-28 DIAGNOSIS — F32A Depression, unspecified: Secondary | ICD-10-CM

## 2016-11-28 DIAGNOSIS — F4310 Post-traumatic stress disorder, unspecified: Secondary | ICD-10-CM | POA: Diagnosis not present

## 2016-11-28 DIAGNOSIS — R413 Other amnesia: Secondary | ICD-10-CM

## 2016-11-28 DIAGNOSIS — Z9114 Patient's other noncompliance with medication regimen: Secondary | ICD-10-CM

## 2016-11-28 DIAGNOSIS — G8929 Other chronic pain: Secondary | ICD-10-CM

## 2016-11-28 DIAGNOSIS — F329 Major depressive disorder, single episode, unspecified: Secondary | ICD-10-CM | POA: Diagnosis not present

## 2016-11-28 DIAGNOSIS — M25561 Pain in right knee: Secondary | ICD-10-CM

## 2016-12-01 ENCOUNTER — Encounter: Payer: Self-pay | Admitting: Internal Medicine
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
## 2016-12-03 ENCOUNTER — Telehealth: Payer: Self-pay | Admitting: Internal Medicine

## 2016-12-03 NOTE — Telephone Encounter (Signed)
Bernard Johnson came into office setting up appointment for 12/24/16 for 5 and 6 pm

## 2016-12-24 ENCOUNTER — Other Ambulatory Visit (INDEPENDENT_AMBULATORY_CARE_PROVIDER_SITE_OTHER): Payer: Medicaid Other | Admitting: Internal Medicine

## 2016-12-24 DIAGNOSIS — R413 Other amnesia: Secondary | ICD-10-CM | POA: Diagnosis not present

## 2017-03-04 ENCOUNTER — Encounter: Payer: Self-pay | Admitting: Internal Medicine

## 2017-03-04 ENCOUNTER — Ambulatory Visit (INDEPENDENT_AMBULATORY_CARE_PROVIDER_SITE_OTHER): Payer: Medicaid Other | Admitting: Internal Medicine

## 2017-03-04 VITALS — BP 122/82 | HR 78 | Resp 12 | Ht 62.0 in | Wt 151.0 lb

## 2017-03-04 DIAGNOSIS — G8929 Other chronic pain: Secondary | ICD-10-CM

## 2017-03-04 DIAGNOSIS — F32A Depression, unspecified: Secondary | ICD-10-CM

## 2017-03-04 DIAGNOSIS — Z79899 Other long term (current) drug therapy: Secondary | ICD-10-CM

## 2017-03-04 DIAGNOSIS — F4310 Post-traumatic stress disorder, unspecified: Secondary | ICD-10-CM

## 2017-03-04 DIAGNOSIS — F329 Major depressive disorder, single episode, unspecified: Secondary | ICD-10-CM | POA: Diagnosis not present

## 2017-03-04 DIAGNOSIS — M25561 Pain in right knee: Secondary | ICD-10-CM | POA: Diagnosis not present

## 2017-03-04 NOTE — Progress Notes (Signed)
Home visit to assess memory with MMSE Son in law interprets Both Bernard Johnson and wife, Bernard Johnson, attempted ESL classes in past, but stopped as they felt they could not remember anything.  Current Meds  Medication Sig  . diclofenac (VOLTAREN) 75 MG EC tablet TAKE 1 TABLET (75 MG TOTAL) BY MOUTH 2 (TWO) TIMES DAILY.  Marland Kitchen. sertraline (ZOLOFT) 50 MG tablet 1 tab by mouth daily   No Known Allergies  MMSE - Mini Mental State Exam 12/24/2016  Orientation to time 0  Orientation to Place 1  Registration 3  Attention/ Calculation 0  Recall 0  Language- name 2 objects 2  Language- repeat 1  Language- follow 3 step command 2  Language- read & follow direction 0  Write a sentence 0  Copy design 0  Total score 9    Assessment/Plan:   Significant abnormalities with MMSE evaluation.   Will contact Bernard Johnson Law with referral to help with citizenship.

## 2017-03-04 NOTE — Progress Notes (Signed)
   Subjective:    Patient ID: Bernard Johnson, male    DOB: 09/08/1951, 66 y.o.   MRN: [id number]  HPI   Received a letter that will lose SSI if do not get citizenship in 6 months.  Received about 1 month ago per son in [place], Bernard Johnson.   Call placed earlier today with Bernard Johnson regarding asking for help with this.  1.  Depression:  Not sleeping well due to right knee pain:  Posterior lateral knee pain.    2.  Right knee pain:  Mild degenerative changes last year on xray.  Is still not getting out and about with knee pain.  Is taking Diclofenac twice daily with food.  Current Meds  Medication Sig  . albuterol (PROVENTIL HFA;VENTOLIN HFA) 108 (90 Base) MCG/ACT inhaler Inhale 1-2 puffs into the lungs every 4 (four) hours as needed for wheezing or shortness of breath.  . diclofenac (VOLTAREN) 75 MG EC tablet TAKE 1 TABLET (75 MG TOTAL) BY MOUTH 2 (TWO) TIMES DAILY.  Marland Kitchen. sertraline (ZOLOFT) 50 MG tablet 1 tab by mouth daily  . traZODone (DESYREL) 50 MG tablet Take 0.5-1 tablets (25-50 mg total) by mouth at bedtime as needed for sleep.    No Known Allergies    Review of Systems     Objective:   Physical Exam   Lung:  CTA CV:  RRR without murmur or rub, radial pulses normal and equal Fullness in popliteal fossae bilaterally  Pain, right knee medial collateral ligament area with stress of medial collateral ligament     Assessment & Plan:  1.  Right Knee pain and effusion:  Instead of aspiration and injection today, will send to ortho to ascertain if significant internal derangement of knee that needs to be addressed.  2.  Depression:  History of PTSD as well.  Has not followed up with Bernard Johnson. Knight, LCSW.  Suspect due to transportation and not able to get family to get him here or to have interpretation even at home.  3.  Citizenship:  [facility] with [facility].  Bernard Johnson to drop off paper stating will lose SSI if no citizenship in 6 months.  Check CBC, CMP with long time use of  Diclofenac

## 2017-03-05 LAB — COMPREHENSIVE METABOLIC PANEL
ALK PHOS: 86 IU/L (ref 39–117)
ALT: 46 IU/L — AB (ref 0–44)
AST: 53 IU/L — AB (ref 0–40)
Albumin/Globulin Ratio: 1.8 (ref 1.2–2.2)
Albumin: 4.4 g/dL (ref 3.6–4.8)
BILIRUBIN TOTAL: 0.3 mg/dL (ref 0.0–1.2)
BUN/Creatinine Ratio: 10 (ref 10–24)
BUN: 10 mg/dL (ref 8–27)
CHLORIDE: 105 mmol/L (ref 96–106)
CO2: 29 mmol/L (ref 18–29)
Calcium: 9.7 mg/dL (ref 8.6–10.2)
Creatinine, Ser: 0.97 mg/dL (ref 0.76–1.27)
GFR calc Af Amer: 94 mL/min/{1.73_m2} (ref >59)
GFR calc non Af Amer: 82 mL/min/{1.73_m2} (ref >59)
GLUCOSE: 81 mg/dL (ref 65–99)
Globulin, Total: 2.5 g/dL (ref 1.5–4.5)
POTASSIUM: 5.4 mmol/L — AB (ref 3.5–5.2)
Sodium: 144 mmol/L (ref 134–144)
TOTAL PROTEIN: 6.9 g/dL (ref 6.0–8.5)

## 2017-03-05 LAB — CBC WITH DIFFERENTIAL/PLATELET
BASOS ABS: 0 10*3/uL (ref 0.0–0.2)
Basos: 1 %
EOS (ABSOLUTE): 0.3 10*3/uL (ref 0.0–0.4)
Eos: 4 %
HEMOGLOBIN: 12.5 g/dL — AB (ref 13.0–17.7)
Hematocrit: 38.1 % (ref 37.5–51.0)
IMMATURE GRANS (ABS): 0 10*3/uL (ref 0.0–0.1)
Immature Granulocytes: 1 %
LYMPHS: 37 %
Lymphocytes Absolute: 2.4 10*3/uL (ref 0.7–3.1)
MCH: 30 pg (ref 26.6–33.0)
MCHC: 32.8 g/dL (ref 31.5–35.7)
MCV: 91 fL (ref 79–97)
MONOCYTES: 10 %
Monocytes Absolute: 0.6 10*3/uL (ref 0.1–0.9)
NEUTROS PCT: 47 %
Neutrophils Absolute: 3.1 10*3/uL (ref 1.4–7.0)
Platelets: 257 10*3/uL (ref 150–379)
RBC: 4.17 x10E6/uL (ref 4.14–5.80)
RDW: 13.9 % (ref 12.3–15.4)
WBC: 6.4 10*3/uL (ref 3.4–10.8)

## 2017-04-22 ENCOUNTER — Telehealth: Payer: Self-pay | Admitting: Internal Medicine

## 2017-04-22 NOTE — Telephone Encounter (Signed)
[name] called with questions memory test to be scheduled for Bernard Johnson and [employer] Siebels.  Please call  Him at [phone].

## 2017-04-22 NOTE — Telephone Encounter (Signed)
Spoke with Bernard Johnson. Will bring in form that states when they have to turn in everything for citizenship. Per Dr. Delrae AlfredMulberry she needs that form. Bernard Johnson states he will drop this off tomorrow.

## 2017-05-12 ENCOUNTER — Other Ambulatory Visit (INDEPENDENT_AMBULATORY_CARE_PROVIDER_SITE_OTHER): Payer: Medicaid Other

## 2017-05-12 DIAGNOSIS — Z79899 Other long term (current) drug therapy: Secondary | ICD-10-CM | POA: Diagnosis not present

## 2017-05-13 ENCOUNTER — Other Ambulatory Visit: Payer: Self-pay | Admitting: Internal Medicine

## 2017-05-13 DIAGNOSIS — F32A Depression, unspecified: Secondary | ICD-10-CM

## 2017-05-13 DIAGNOSIS — F329 Major depressive disorder, single episode, unspecified: Secondary | ICD-10-CM

## 2017-05-13 LAB — HEPATIC FUNCTION PANEL
ALT: 34 IU/L (ref 0–44)
AST: 37 IU/L (ref 0–40)
Albumin: 4.7 g/dL (ref 3.6–4.8)
Alkaline Phosphatase: 90 IU/L (ref 39–117)
BILIRUBIN, DIRECT: 0.1 mg/dL (ref 0.00–0.40)
Total Protein: 7.1 g/dL (ref 6.0–8.5)

## 2017-06-09 ENCOUNTER — Encounter: Payer: Self-pay | Admitting: Internal Medicine

## 2017-06-09 ENCOUNTER — Ambulatory Visit (INDEPENDENT_AMBULATORY_CARE_PROVIDER_SITE_OTHER): Payer: Medicaid Other | Admitting: Internal Medicine

## 2017-06-09 VITALS — BP 98/72 | HR 72 | Resp 12 | Ht 62.0 in | Wt 150.0 lb

## 2017-06-09 DIAGNOSIS — F4310 Post-traumatic stress disorder, unspecified: Secondary | ICD-10-CM | POA: Diagnosis not present

## 2017-06-09 DIAGNOSIS — G8929 Other chronic pain: Secondary | ICD-10-CM

## 2017-06-09 DIAGNOSIS — F329 Major depressive disorder, single episode, unspecified: Secondary | ICD-10-CM

## 2017-06-09 DIAGNOSIS — M25561 Pain in right knee: Secondary | ICD-10-CM

## 2017-06-09 DIAGNOSIS — F32A Depression, unspecified: Secondary | ICD-10-CM

## 2017-06-09 NOTE — Progress Notes (Signed)
   Subjective:    Patient ID: Bernard BurkittJit Merle, male    DOB: 02/22/1951, 66 y.o.   MRN: [id number]  HPI  Citizenship follow up Can only write name in [place]. Reads a little bit in own language  Never heard about follow up with Dr. Despina HickAlusio with right knee.  Hamilton states the knee does not bother him any longer  Left side pain:  Cannot sleep on this side. Only bothers him when sleeping.  Depression:  Did not fill Sertraline from May 20 until August 16.  States was told by pharmacy the clinic stopped the med.  In actuality, the prescription ran out. Was refilled 2 days after requested refill on August 18 and should be available for pick up.  Current Meds  Medication Sig  . albuterol (PROVENTIL HFA;VENTOLIN HFA) 108 (90 Base) MCG/ACT inhaler Inhale 1-2 puffs into the lungs every 4 (four) hours as needed for wheezing or shortness of breath.  . [DISCONTINUED] traZODone (DESYREL) 50 MG tablet Take 0.5-1 tablets (25-50 mg total) by mouth at bedtime as needed for sleep.   No Known Allergies      Review of Systems     Objective:   Physical Exam  NAD, animated today Lungs:  CTA CV:  RRR without murmur or rub, radial pulses normal and equal LE:  No swelling or erythema of knees.  Full ROM. NT Moves easily, NT on left side.          Assessment & Plan:  1.  Memory and learning issues:  Still trying to complete paperwork for citizenship, [phone] Has been a delay getting information from family  2.  Depression/PTSD:  Again, has been off medication again.  Urged to take med list to pharmacy each time and check off what he is supposed to receive.  Went over how to tell if still with refills and to call if not.  3.  Left sided complaints only with sleeping:  Not clear what could be causing.  Encouraged looking at mattress and considering a body pillow if feasible

## 2017-06-17 ENCOUNTER — Other Ambulatory Visit (INDEPENDENT_AMBULATORY_CARE_PROVIDER_SITE_OTHER): Payer: Self-pay | Admitting: Licensed Clinical Social Worker

## 2017-06-17 DIAGNOSIS — F4310 Post-traumatic stress disorder, unspecified: Secondary | ICD-10-CM
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
## 2017-06-23 ENCOUNTER — Telehealth: Payer: Self-pay

## 2017-06-23 NOTE — Telephone Encounter (Signed)
Bernard Johnson called stating no medications have been refilled for Bernard Johnson. States Dr. Delrae Alfred stated she was refilling his medications at his OV on 06/09/17. Bernard Johnson states he needs all his medications. To Dr. Delrae Alfred to advise.

## 2017-06-24 NOTE — Telephone Encounter (Signed)
Please check on which meds he would like filled.  As I discussed with them at the visit, the Sertraline had been filled within 2 days of them calling in August.   Does he need the Diclofenac refilled?  Was hoping to discontinue that.

## 2017-06-25 ENCOUNTER — Other Ambulatory Visit: Payer: Self-pay | Admitting: Internal Medicine

## 2017-06-25 DIAGNOSIS — G4700 Insomnia, unspecified: Secondary | ICD-10-CM

## 2017-06-25 DIAGNOSIS — M722 Plantar fascial fibromatosis: Secondary | ICD-10-CM

## 2017-06-26 NOTE — Telephone Encounter (Signed)
Dr. Delrae Alfred spoke with rewati. Rx sent over from pharmacy. Rx's renewed

## 2017-09-30 ENCOUNTER — Encounter: Payer: Medicaid Other | Admitting: Internal Medicine

## 2018-07-28 ENCOUNTER — Other Ambulatory Visit: Payer: Self-pay | Admitting: Family Medicine

## 2022-08-28 ENCOUNTER — Encounter (HOSPITAL_COMMUNITY): Payer: Self-pay

## 2022-08-28 ENCOUNTER — Telehealth (HOSPITAL_COMMUNITY): Payer: Self-pay | Admitting: Internal Medicine

## 2022-08-28 ENCOUNTER — Ambulatory Visit (INDEPENDENT_AMBULATORY_CARE_PROVIDER_SITE_OTHER): Payer: Medicare Other

## 2022-08-28 ENCOUNTER — Ambulatory Visit (HOSPITAL_COMMUNITY)
Admission: EM | Admit: 2022-08-28 | Discharge: 2022-08-28 | Disposition: A | Discharge status: Home / Self Care | Payer: Medicare Other | Attending: Internal Medicine | Admitting: Internal Medicine

## 2022-08-28 ENCOUNTER — Ambulatory Visit (HOSPITAL_COMMUNITY): Payer: Medicare Other

## 2022-08-28 DIAGNOSIS — R0602 Shortness of breath: Secondary | ICD-10-CM

## 2022-08-28 DIAGNOSIS — J189 Pneumonia, unspecified organism: Secondary | ICD-10-CM | POA: Diagnosis present

## 2022-08-28 DIAGNOSIS — J101 Influenza due to other identified influenza virus with other respiratory manifestations: Secondary | ICD-10-CM | POA: Insufficient documentation

## 2022-08-28 DIAGNOSIS — J4521 Mild intermittent asthma with (acute) exacerbation: Secondary | ICD-10-CM | POA: Diagnosis present

## 2022-08-28 DIAGNOSIS — R059 Cough, unspecified: Secondary | ICD-10-CM | POA: Diagnosis not present

## 2022-08-28 DIAGNOSIS — Z1152 Encounter for screening for COVID-19: Secondary | ICD-10-CM | POA: Insufficient documentation

## 2022-08-28 DIAGNOSIS — R509 Fever, unspecified: Secondary | ICD-10-CM

## 2022-08-28 LAB — CBC WITH DIFFERENTIAL/PLATELET
Abs Immature Granulocytes: 0 10*3/uL (ref 0.00–0.07)
Basophils Absolute: 0 10*3/uL (ref 0.0–0.1)
Basophils Relative: 0 %
Eosinophils Absolute: 0 10*3/uL (ref 0.0–0.5)
Eosinophils Relative: 0 %
HCT: 38.1 % — ABNORMAL LOW (ref 39.0–52.0)
Hemoglobin: 12.6 g/dL — ABNORMAL LOW (ref 13.0–17.0)
Lymphocytes Relative: 15 %
Lymphs Abs: 1.8 10*3/uL (ref 0.7–4.0)
MCH: 29.2 pg (ref 26.0–34.0)
MCHC: 33.1 g/dL (ref 30.0–36.0)
MCV: 88.2 fL (ref 80.0–100.0)
Monocytes Absolute: 1.2 10*3/uL — ABNORMAL HIGH (ref 0.1–1.0)
Monocytes Relative: 10 %
Neutro Abs: 9.2 10*3/uL — ABNORMAL HIGH (ref 1.7–7.7)
Neutrophils Relative %: 75 %
Platelets: 191 10*3/uL (ref 150–400)
RBC: 4.32 MIL/uL (ref 4.22–5.81)
RDW: 14.2 % (ref 11.5–15.5)
WBC: 12.2 10*3/uL — ABNORMAL HIGH (ref 4.0–10.5)
nRBC: 0 % (ref 0.0–0.2)
nRBC: 0 /100 WBC

## 2022-08-28 LAB — COMPREHENSIVE METABOLIC PANEL
ALT: 31 U/L (ref 0–44)
AST: 43 U/L — ABNORMAL HIGH (ref 15–41)
Albumin: 3.8 g/dL (ref 3.5–5.0)
Alkaline Phosphatase: 54 U/L (ref 38–126)
Anion gap: 11 (ref 5–15)
BUN: 12 mg/dL (ref 8–23)
CO2: 23 mmol/L (ref 22–32)
Calcium: 8.4 mg/dL — ABNORMAL LOW (ref 8.9–10.3)
Chloride: 101 mmol/L (ref 98–111)
Creatinine, Ser: 1.17 mg/dL (ref 0.61–1.24)
GFR, Estimated: >60 mL/min (ref >60)
Glucose, Bld: 106 mg/dL — ABNORMAL HIGH (ref 70–99)
Potassium: 4.3 mmol/L (ref 3.5–5.1)
Sodium: 135 mmol/L (ref 135–145)
Total Bilirubin: 1.1 mg/dL (ref 0.3–1.2)
Total Protein: 7.4 g/dL (ref 6.5–8.1)

## 2022-08-28 LAB — RESP PANEL BY RT-PCR (FLU A&B, COVID) ARPGX2
Influenza A by PCR: POSITIVE — AB
Influenza B by PCR: NEGATIVE
SARS Coronavirus 2 by RT PCR: NEGATIVE

## 2022-08-28 MED ORDER — OSELTAMIVIR PHOSPHATE 75 MG PO CAPS: 75.0000 mg | ORAL_CAPSULE | Freq: Two times a day (BID) | ORAL | 0 refills | Status: AC

## 2022-08-28 MED ORDER — ALBUTEROL SULFATE (2.5 MG/3ML) 0.083% IN NEBU
2.5000 mg | INHALATION_SOLUTION | Freq: Once | RESPIRATORY_TRACT | Status: AC
  Administered 2022-08-28: 2.5 mg via RESPIRATORY_TRACT

## 2022-08-28 MED ORDER — GUAIFENESIN ER 600 MG PO TB12: 600.0000 mg | ORAL_TABLET | Freq: Two times a day (BID) | ORAL | 0 refills | Status: AC

## 2022-08-28 MED ORDER — ALBUTEROL SULFATE (2.5 MG/3ML) 0.083% IN NEBU
INHALATION_SOLUTION | RESPIRATORY_TRACT | Status: AC
  Filled 2022-08-28: qty 3

## 2022-08-28 MED ORDER — METHYLPREDNISOLONE SODIUM SUCC 125 MG IJ SOLR
60.0000 mg | Freq: Once | INTRAMUSCULAR | Status: AC
  Administered 2022-08-28: 60 mg via INTRAMUSCULAR

## 2022-08-28 MED ORDER — PREDNISONE 10 MG PO TABS: 40.0000 mg | ORAL_TABLET | Freq: Every day | ORAL | 0 refills | Status: AC

## 2022-08-28 MED ORDER — METHYLPREDNISOLONE SODIUM SUCC 125 MG IJ SOLR
INTRAMUSCULAR | Status: AC
  Filled 2022-08-28: qty 2

## 2022-08-28 MED ORDER — AMOXICILLIN-POT CLAVULANATE 875-125 MG PO TABS: 1.0000 | ORAL_TABLET | Freq: Two times a day (BID) | ORAL | 0 refills | Status: AC

## 2022-08-28 MED ORDER — DOXYCYCLINE HYCLATE 100 MG PO CAPS: 100.0000 mg | ORAL_CAPSULE | Freq: Two times a day (BID) | ORAL | 0 refills | Status: AC

## 2022-08-28 MED ORDER — BENZONATATE 100 MG PO CAPS: 100.0000 mg | ORAL_CAPSULE | Freq: Three times a day (TID) | ORAL | 0 refills | Status: AC | PRN
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
## 2022-08-28 NOTE — Telephone Encounter (Signed)
Influenza A is positive.  Tamiflu has been sent to the pharmacy.  Patient's symptoms started within the last 48 hours. Called the phone number on file. No answer.

## 2022-08-28 NOTE — Discharge Instructions (Addendum)
Please maintain adequate Use your albuterol inhaler as recommended Take medications as prescribed We will call you with recommendations if labs are abnormal If you have worsening shortness of breath, wheezing, persistent fever, confusion or persistent vomiting please return to urgent care to be reevaluated.

## 2022-08-28 NOTE — ED Triage Notes (Signed)
2-days of cough, chest congestion and wheezing. Pt reports low grade fever. Pt is coughing up mucous.
